# Patient Record
Sex: Male | Born: 1990
Health system: Southern US, Community
[De-identification: ages and names within clinical notes are randomized; demographics above are authoritative.]

---
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
## 2011-06-26 ENCOUNTER — Emergency Department (HOSPITAL_COMMUNITY)
Admission: EM | Admit: 2011-06-26 | Discharge: 2011-06-26 | Disposition: A | Discharge status: Home / Self Care | Payer: BC Managed Care – PPO | Attending: Emergency Medicine | Admitting: Emergency Medicine

## 2011-06-26 ENCOUNTER — Encounter (HOSPITAL_COMMUNITY): Payer: Self-pay | Admitting: *Deleted

## 2011-06-26 DIAGNOSIS — R040 Epistaxis: Secondary | ICD-10-CM

## 2011-06-26 DIAGNOSIS — F17200 Nicotine dependence, unspecified, uncomplicated: Secondary | ICD-10-CM | POA: Insufficient documentation

## 2011-06-26 DIAGNOSIS — Z806 Family history of leukemia: Secondary | ICD-10-CM | POA: Insufficient documentation

## 2011-06-26 LAB — DIFFERENTIAL
Basophils Absolute: 0 10*3/uL (ref 0.0–0.1)
Basophils Relative: 0 % (ref 0–1)
Eosinophils Absolute: 0.2 10*3/uL (ref 0.0–0.7)
Eosinophils Relative: 4 % (ref 0–5)
Lymphocytes Relative: 39 % (ref 12–46)
Lymphs Abs: 2.5 10*3/uL (ref 0.7–4.0)
Monocytes Absolute: 0.4 10*3/uL (ref 0.1–1.0)
Monocytes Relative: 6 % (ref 3–12)
Neutro Abs: 3.3 10*3/uL (ref 1.7–7.7)
Neutrophils Relative %: 51 % (ref 43–77)

## 2011-06-26 LAB — CBC
HCT: 43.2 % (ref 39.0–52.0)
Hemoglobin: 15.2 g/dL (ref 13.0–17.0)
MCH: 30.1 pg (ref 26.0–34.0)
MCHC: 35.2 g/dL (ref 30.0–36.0)
MCV: 85.5 fL (ref 78.0–100.0)
Platelets: 226 10*3/uL (ref 150–400)
RBC: 5.05 MIL/uL (ref 4.22–5.81)
RDW: 12.1 % (ref 11.5–15.5)
WBC: 6.5 10*3/uL (ref 4.0–10.5)

## 2011-06-26 LAB — PROTIME-INR
INR: 1.1 (ref 0.00–1.49)
Prothrombin Time: 14.4 seconds (ref 11.6–15.2)

## 2011-06-26 LAB — APTT: aPTT: 33 seconds (ref 24–37)

## 2011-06-26 NOTE — Discharge Instructions (Signed)
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
## 2011-06-26 NOTE — ED Provider Notes (Signed)
History     CSN: [id number]  Arrival date & time 06/26/11  0559   First MD Initiated Contact with Patient 06/26/11 [phone]      Chief Complaint  Patient presents with  . Epistaxis    (Consider location/radiation/quality/duration/timing/severity/associated sxs/prior treatment) HPI Comments: Was at work, blew nose and it started bleeding a lot.  Took multiple paper towels to stop it.    Patient is a 21 y.o. male presenting with nosebleeds. The history is provided by the patient and a relative (mother).  Epistaxis  This is a new problem. The current episode started 1 to 2 hours ago. The problem occurs constantly. The problem has been resolved. Associated with: blowing nose. The bleeding has been from the left nare. He has tried applying pressure for the symptoms. His past medical history does not include bleeding disorder or sinus problems.    History reviewed. No pertinent past medical history.  History reviewed. No pertinent past surgical history.  No family history on file.  History  Substance Use Topics  . Smoking status: Current Everyday Smoker -- 1.0 packs/day    Types: Cigarettes  . Smokeless tobacco: Not on file  . Alcohol Use: No      Review of Systems  HENT: Positive for nosebleeds.   All other systems reviewed and are negative.    Allergies  Review of patient's allergies indicates no known allergies.  Home Medications  No current outpatient prescriptions on file.  BP 145/78  Pulse 82  Temp 98.1 F (36.7 C)  Resp 20  Ht 5\' 6"  (1.676 m)  Wt 160 lb (72.576 kg)  BMI 25.82 kg/m2  SpO2 98%  Physical Exam  Nursing note and vitals reviewed. Constitutional: He is oriented to person, place, and time. He appears well-developed and well-nourished. No distress.  HENT:  Head: Normocephalic and atraumatic.  Mouth/Throat: Oropharynx is clear and moist.       The left nare has old blood.  No active bleeding.    Neck: Normal range of motion. Neck supple.    Neurological: He is alert and oriented to person, place, and time.  Skin: Skin is warm and dry. He is not diaphoretic.    ED Course  Procedures (including critical care time)  Labs Reviewed - No data to display No results found.   No diagnosis found.    MDM  The CBC and coags all look okay.  The mother lost one son to leukemia and is understandably uneasy about this.  I have done my best to reassure her.  Direct pressure if recurs and follow up with ENT prn.          Geoffery Lyons, MD 06/26/11 [phone]

## 2013-04-22 ENCOUNTER — Ambulatory Visit (HOSPITAL_COMMUNITY)
Admission: RE | Admit: 2013-04-22 | Discharge: 2013-04-22 | Disposition: A | Discharge status: Home / Self Care | Payer: BC Managed Care – PPO | Source: Ambulatory Visit | Attending: Pulmonary Disease | Admitting: Pulmonary Disease

## 2013-04-22 ENCOUNTER — Other Ambulatory Visit (HOSPITAL_COMMUNITY): Payer: Self-pay | Admitting: Pulmonary Disease

## 2013-04-22 DIAGNOSIS — M79609 Pain in unspecified limb: Secondary | ICD-10-CM | POA: Insufficient documentation

## 2013-04-22 DIAGNOSIS — X58XXXA Exposure to other specified factors, initial encounter: Secondary | ICD-10-CM | POA: Insufficient documentation

## 2014-08-26 ENCOUNTER — Emergency Department (HOSPITAL_COMMUNITY): Payer: BLUE CROSS/BLUE SHIELD

## 2014-08-26 ENCOUNTER — Emergency Department (HOSPITAL_COMMUNITY)
Admission: EM | Admit: 2014-08-26 | Discharge: 2014-08-26 | Disposition: A | Discharge status: Home / Self Care | Payer: BLUE CROSS/BLUE SHIELD | Attending: Emergency Medicine | Admitting: Emergency Medicine

## 2014-08-26 ENCOUNTER — Encounter (HOSPITAL_COMMUNITY): Payer: Self-pay | Admitting: Emergency Medicine

## 2014-08-26 DIAGNOSIS — Y92410 Unspecified street and highway as the place of occurrence of the external cause: Secondary | ICD-10-CM | POA: Diagnosis not present

## 2014-08-26 DIAGNOSIS — S80819A Abrasion, unspecified lower leg, initial encounter: Secondary | ICD-10-CM | POA: Insufficient documentation

## 2014-08-26 DIAGNOSIS — Y998 Other external cause status: Secondary | ICD-10-CM | POA: Diagnosis not present

## 2014-08-26 DIAGNOSIS — S40819A Abrasion of unspecified upper arm, initial encounter: Secondary | ICD-10-CM | POA: Insufficient documentation

## 2014-08-26 DIAGNOSIS — Z23 Encounter for immunization: Secondary | ICD-10-CM | POA: Insufficient documentation

## 2014-08-26 DIAGNOSIS — Z720 Tobacco use: Secondary | ICD-10-CM | POA: Insufficient documentation

## 2014-08-26 DIAGNOSIS — S299XXA Unspecified injury of thorax, initial encounter: Secondary | ICD-10-CM | POA: Diagnosis not present

## 2014-08-26 DIAGNOSIS — S3991XA Unspecified injury of abdomen, initial encounter: Secondary | ICD-10-CM | POA: Insufficient documentation

## 2014-08-26 DIAGNOSIS — Y9389 Activity, other specified: Secondary | ICD-10-CM | POA: Insufficient documentation

## 2014-08-26 DIAGNOSIS — R52 Pain, unspecified: Secondary | ICD-10-CM

## 2014-08-26 LAB — I-STAT CHEM 8, ED
BUN: 17 mg/dL (ref 6–20)
Calcium, Ion: 1.15 mmol/L (ref 1.12–1.23)
Chloride: 103 mmol/L (ref 101–111)
Creatinine, Ser: 0.8 mg/dL (ref 0.61–1.24)
Glucose, Bld: 94 mg/dL (ref 70–99)
HCT: 50 % (ref 39.0–52.0)
Hemoglobin: 17 g/dL (ref 13.0–17.0)
Potassium: 3.9 mmol/L (ref 3.5–5.1)
Sodium: 140 mmol/L (ref 135–145)
TCO2: 24 mmol/L (ref 0–100)

## 2014-08-26 LAB — ETHANOL: Alcohol, Ethyl (B): <5 mg/dL (ref <5)

## 2014-08-26 MED ORDER — BACITRACIN ZINC 500 UNIT/GM EX OINT
TOPICAL_OINTMENT | CUTANEOUS | Status: AC
  Filled 2014-08-26: qty 7.2

## 2014-08-26 MED ORDER — TETANUS-DIPHTH-ACELL PERTUSSIS 5-2.5-18.5 LF-MCG/0.5 IM SUSP
0.5000 mL | Freq: Once | INTRAMUSCULAR | Status: AC
  Administered 2014-08-26: 0.5 mL via INTRAMUSCULAR
  Filled 2014-08-26: qty 0.5

## 2014-08-26 MED ORDER — IOHEXOL 300 MG/ML  SOLN
100.0000 mL | Freq: Once | INTRAMUSCULAR | Status: AC | PRN
  Administered 2014-08-26: 100 mL via INTRAVENOUS

## 2014-08-26 MED ORDER — TRAMADOL HCL 50 MG PO TABS: 50.0000 mg | ORAL_TABLET | Freq: Four times a day (QID) | ORAL | Status: AC | PRN

## 2014-08-26 MED ORDER — SODIUM CHLORIDE 0.9 % IV BOLUS (SEPSIS)
1000.0000 mL | Freq: Once | INTRAVENOUS | Status: AC
  Administered 2014-08-26: 1000 mL via INTRAVENOUS
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
## 2014-08-26 NOTE — ED Notes (Signed)
With NT John, cleansed all abrasions with wound cleanser and bacitracin applied. Dressed RT elbow with a non-adhesive dressing. Pt tolerated well. Pt and family taught about wound care at home. Verbalized understanding.

## 2014-08-26 NOTE — ED Notes (Signed)
Pt ambulated to bathroom again RN advice.

## 2014-08-26 NOTE — ED Notes (Signed)
Helped Nurse clean all patients wounds before discharge.
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
## 2014-08-26 NOTE — ED Notes (Signed)
X-ray at bedside

## 2014-08-26 NOTE — ED Notes (Signed)
MD Zammit at bedside. 

## 2014-08-26 NOTE — Discharge Instructions (Signed)
Clean abrasions twice a day and follow up with your md next week.
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
## 2014-12-08 IMAGING — CR DG FOREARM 2V*L*
1 series · 1 of 1 positions shown · non-contrast
Comparison: None.

CLINICAL DATA: Pain.

EXAM:
LEFT FOREARM - 2 VIEW

[view not recorded]
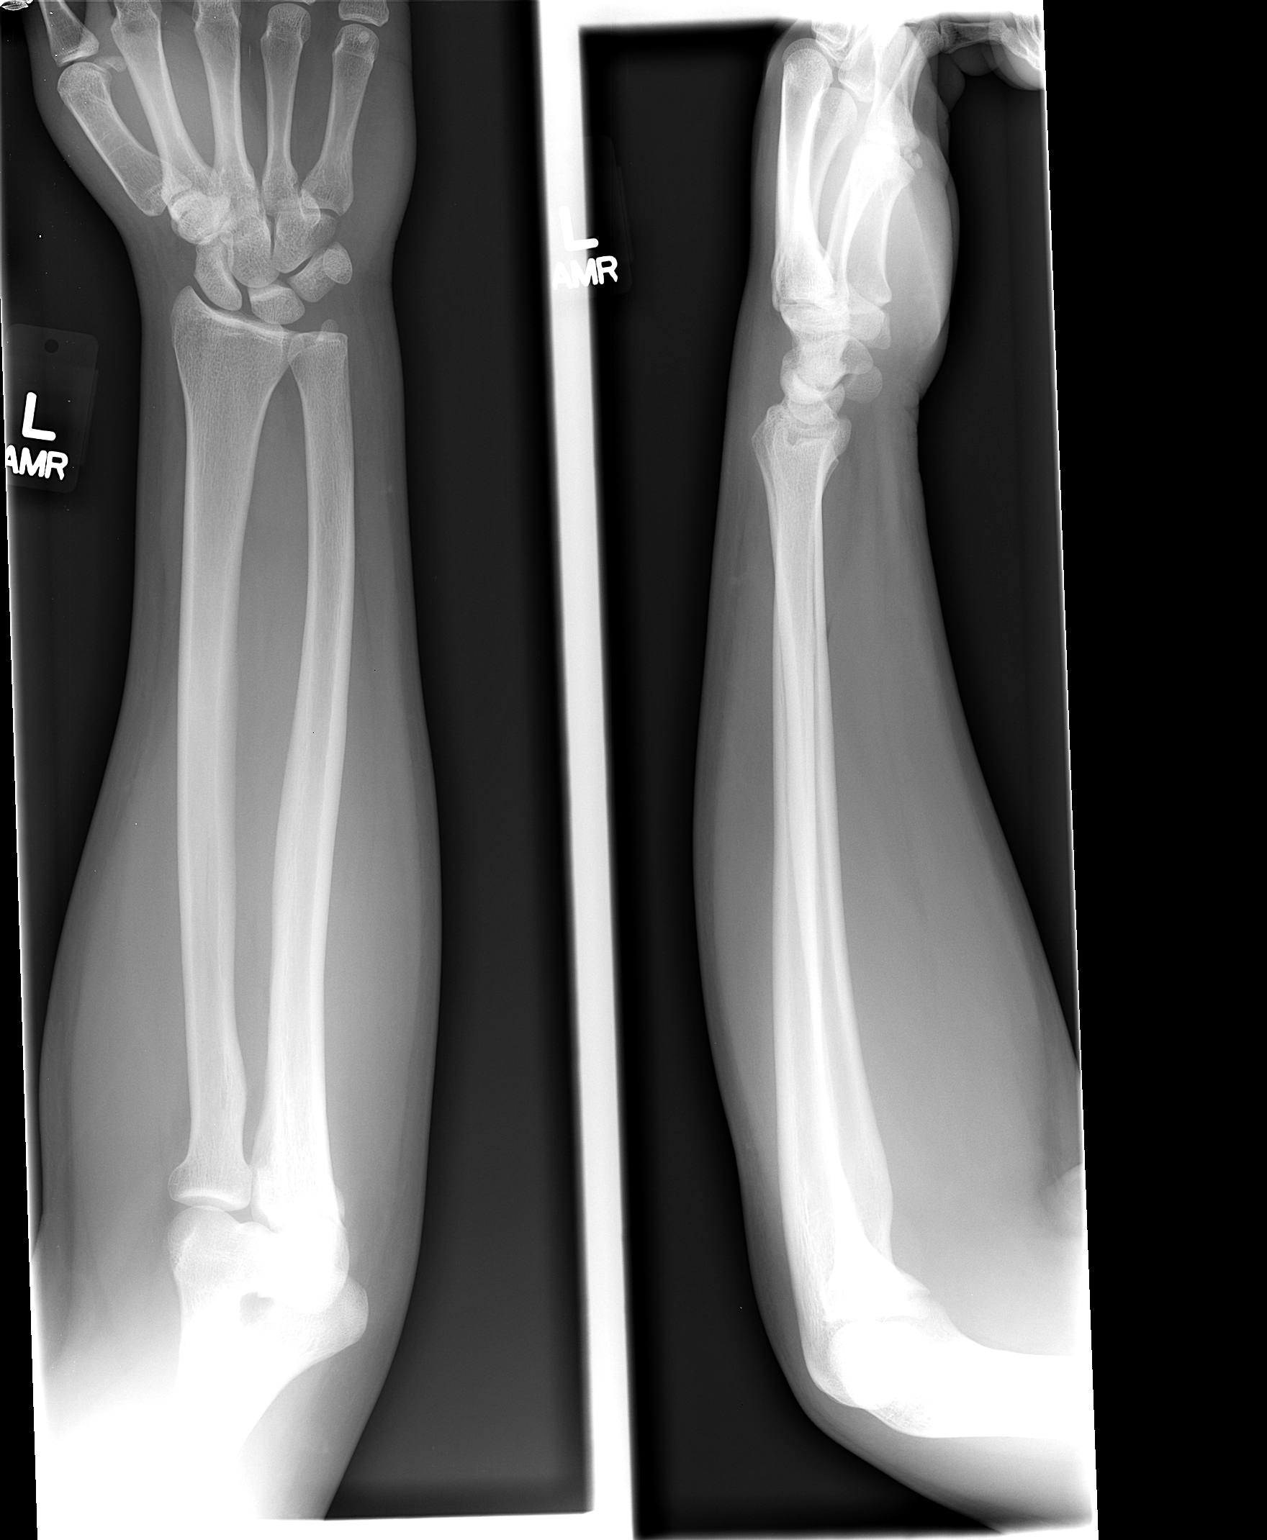

[1 of 1 positions shown; findings below may reference images not displayed]

FINDINGS: There is no evidence of fracture or other focal bone lesions. Soft
tissues are unremarkable.
IMPRESSION: Negative.
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
## 2018-11-01 ENCOUNTER — Other Ambulatory Visit: Payer: BLUE CROSS/BLUE SHIELD

## 2018-11-01 ENCOUNTER — Telehealth: Payer: Self-pay | Admitting: Pulmonary Disease

## 2018-11-01 DIAGNOSIS — Z20822 Contact with and (suspected) exposure to covid-19: Secondary | ICD-10-CM

## 2018-11-01 NOTE — Telephone Encounter (Signed)
Contacted pt and he has been scheduled for the [facility] testing site at 11:45. Pt is aware to remain in his car and to wear a mask. Pt understood and had no additional questions.  Order has been placed

## 2018-11-01 NOTE — Telephone Encounter (Signed)
Becky calling from Dr. Sinda Du office [phone] calling to get the patient ordered for COVID testing.   Symptoms-Exposure to COVID. Work is requesting COVID testing.   Please advise CB for Patient [phone]

## 2018-11-05 LAB — NOVEL CORONAVIRUS, NAA: SARS-CoV-2, NAA: NOT DETECTED
